# Patient Record
Sex: Female | Born: 1990 | Race: Black or African American | Hispanic: No | Smoking: Never smoker
Health system: Southern US, Community
[De-identification: ages and names within clinical notes are randomized; demographics above are authoritative.]

---

## 2015-06-03 ENCOUNTER — Emergency Department (HOSPITAL_COMMUNITY)
Admission: EM | Admit: 2015-06-03 | Discharge: 2015-06-03 | Disposition: A | Discharge status: Home / Self Care | Payer: Medicaid - Out of State | Attending: Emergency Medicine | Admitting: Emergency Medicine

## 2015-06-03 ENCOUNTER — Encounter (HOSPITAL_COMMUNITY): Payer: Self-pay | Admitting: Vascular Surgery

## 2015-06-03 DIAGNOSIS — L02811 Cutaneous abscess of head [any part, except face]: Secondary | ICD-10-CM | POA: Insufficient documentation

## 2015-06-03 DIAGNOSIS — L089 Local infection of the skin and subcutaneous tissue, unspecified: Secondary | ICD-10-CM | POA: Insufficient documentation

## 2015-06-03 MED ORDER — SULFAMETHOXAZOLE-TRIMETHOPRIM 800-160 MG PO TABS
1.0000 | ORAL_TABLET | Freq: Two times a day (BID) | ORAL | Status: AC
Start: 2015-06-03 — End: 2015-06-10

## 2015-06-03 MED ORDER — IBUPROFEN 600 MG PO TABS: 600.0000 mg | ORAL_TABLET | Freq: Four times a day (QID) | ORAL | Status: AC | PRN

## 2015-06-03 NOTE — Discharge Instructions (Signed)
Read the information below.  Use the prescribed medication as directed.  Please discuss all new medications with your pharmacist.  You may return to the Emergency Department at any time for worsening condition or any new symptoms that concern you.   If there is any possibility that you might be pregnant, please let your health care provider know and discuss this with the pharmacist to ensure medication safety.   If you develop increased redness, swelling, uncontrolled pain, or fevers greater than 100.4, return to the ER immediately for a recheck.     Abscess An abscess is an infected area that contains a collection of pus and debris.It can occur in almost any part of the body. An abscess is also known as a furuncle or boil. CAUSES  An abscess occurs when tissue gets infected. This can occur from blockage of oil or sweat glands, infection of hair follicles, or a minor injury to the skin. As the body tries to fight the infection, pus collects in the area and creates pressure under the skin. This pressure causes pain. People with weakened immune systems have difficulty fighting infections and get certain abscesses more often.  SYMPTOMS Usually an abscess develops on the skin and becomes a painful mass that is red, warm, and tender. If the abscess forms under the skin, you may feel a moveable soft area under the skin. Some abscesses break open (rupture) on their own, but most will continue to get worse without care. The infection can spread deeper into the body and eventually into the bloodstream, causing you to feel ill.  DIAGNOSIS  Your caregiver will take your medical history and perform a physical exam. A sample of fluid may also be taken from the abscess to determine what is causing your infection. TREATMENT  Your caregiver may prescribe antibiotic medicines to fight the infection. However, taking antibiotics alone usually does not cure an abscess. Your caregiver may need to make a small cut (incision)  in the abscess to drain the pus. In some cases, gauze is packed into the abscess to reduce pain and to continue draining the area. HOME CARE INSTRUCTIONS   Only take over-the-counter or prescription medicines for pain, discomfort, or fever as directed by your caregiver.  If you were prescribed antibiotics, take them as directed. Finish them even if you start to feel better.  If gauze is used, follow your caregiver's directions for changing the gauze.  To avoid spreading the infection:  Keep your draining abscess covered with a bandage.  Wash your hands well.  Do not share personal care items, towels, or whirlpools with others.  Avoid skin contact with others.  Keep your skin and clothes clean around the abscess.  Keep all follow-up appointments as directed by your caregiver. SEEK MEDICAL CARE IF:   You have increased pain, swelling, redness, fluid drainage, or bleeding.  You have muscle aches, chills, or a general ill feeling.  You have a fever. MAKE SURE YOU:   Understand these instructions.  Will watch your condition.  Will get help right away if you are not doing well or get worse. Document Released: 06/24/2005 Document Revised: 03/15/2012 Document Reviewed: 11/27/2011 [facility] Patient Information 2015 [place], [place]. This information is not intended to replace advice given to you by your health care provider. Make sure you discuss any questions you have with your health care provider.  Cellulitis Cellulitis is an infection of the skin and the tissue beneath it. The infected area is usually red and tender. Cellulitis  occurs most often in the arms and lower legs.  CAUSES  Cellulitis is caused by bacteria that enter the skin through cracks or cuts in the skin. The most common types of bacteria that cause cellulitis are staphylococci and streptococci. SIGNS AND SYMPTOMS   Redness and warmth.  Swelling.  Tenderness or pain.  Fever. DIAGNOSIS  Your health care  provider can usually determine what is wrong based on a physical exam. Blood tests may also be done. TREATMENT  Treatment usually involves taking an antibiotic medicine. HOME CARE INSTRUCTIONS   Take your antibiotic medicine as directed by your health care provider. Finish the antibiotic even if you start to feel better.  Keep the infected arm or leg elevated to reduce swelling.  Apply a warm cloth to the affected area up to 4 times per day to relieve pain.  Take medicines only as directed by your health care provider.  Keep all follow-up visits as directed by your health care provider. SEEK MEDICAL CARE IF:   You notice red streaks coming from the infected area.  Your red area gets larger or turns dark in color.  Your bone or joint underneath the infected area becomes painful after the skin has healed.  Your infection returns in the same area or another area.  You notice a swollen bump in the infected area.  You develop new symptoms.  You have a fever. SEEK IMMEDIATE MEDICAL CARE IF:   You feel very sleepy.  You develop vomiting or diarrhea.  You have a general ill feeling (malaise) with muscle aches and pains. MAKE SURE YOU:   Understand these instructions.  Will watch your condition.  Will get help right away if you are not doing well or get worse. Document Released: 06/24/2005 Document Revised: 01/29/2014 Document Reviewed: 11/30/2011 [facility] Patient Information 2015 [place], [place]. This information is not intended to replace advice given to you by your health care provider. Make sure you discuss any questions you have with your health care provider.

## 2015-06-03 NOTE — ED Notes (Signed)
Declined W/C at D/C and was escorted to lobby by RN. 

## 2015-06-03 NOTE — ED Provider Notes (Signed)
CSN: [id number]     Arrival date & time 06/03/15  1739 History  This chart was scribed for non-physician practitioner working Trixie Dredge PA-C with Lavera Guise, MD by Lyndel Safe, ED Scribe. This patient was seen in room TR06C/TR06C and the patient's care was started at 5:54 PM.    Chief Complaint  Patient presents with  . Abscess   The history is provided by the patient. No language interpreter was used.   HPI Comments: Stephanie Ellison is a 24 y.o. female who presents to the Emergency Department complaining of a gradually worsening area of pain and swelling to right side of scalp onset 4 days. The pt reports the pain in the area to be sore. She has applied neosporin and peroxide with no relief. Denies drainage form the area, fevers, chills, nausea, vomiting, headaches, vision changes, otalgia, or dental pain. Pt additionally denies a chance of pregnancy, any known allergies to antibiotics or currently breastfeeding.   History reviewed. No pertinent past medical history. Past Surgical History  Procedure Laterality Date  . Cesarean section     No family history on file. Social History  Substance Use Topics  . Smoking status: Never Smoker   . Smokeless tobacco: Never Used  . Alcohol Use: Yes     Comment: occasionally   OB History    No data available     Review of Systems  Constitutional: Negative for fever and chills.  HENT: Negative for dental problem and ear pain.   Eyes: Negative for visual disturbance.  Gastrointestinal: Negative for nausea and vomiting.  Skin: Positive for color change ( right scalp).  Allergic/Immunologic: Negative for immunocompromised state.  Neurological: Negative for headaches.  Hematological: Does not bruise/bleed easily.  All other systems reviewed and are negative.  Allergies  Review of patient's allergies indicates not on file.  Home Medications   Prior to Admission medications   Not on File   BP 112/68 mmHg  Pulse 89  Temp(Src) 98.8  F (37.1 C) (Oral)  Resp 16  SpO2 100% Physical Exam  Constitutional: She appears well-developed and well-nourished. No distress.  HENT:  Head: Normocephalic and atraumatic.    Right, parietal scalp; single pustule that is TTP with localized erythema surrounding, no edema, no induration, no fluctuance   Eyes: Conjunctivae are normal.  Neck: Normal range of motion. Neck supple.  Pulmonary/Chest: Effort normal.  Neurological: She is alert.  Skin: She is not diaphoretic. There is erythema.  See HENT  Nursing note and vitals reviewed.   ED Course  Procedures  DIAGNOSTIC STUDIES: Oxygen Saturation is 100% on RA, normal by my interpretation.    COORDINATION OF CARE: 5:56 PM Discussed treatment plan with pt. Pt acknowledges and agrees to plan.  6:09 PM Visualized underlying soft tissue with [place]. Discussed with pt plan to prescribe antibiotics and advised pt to apply hot compresses to affected area. Pt acknowledges and agrees to plan.  Labs Review Labs Reviewed - No data to display  Imaging Review No results found. I have personally reviewed and evaluated these images and lab results as part of my medical decision-making.   EKG Interpretation None       EMERGENCY DEPARTMENT US SOFT TISSUE INTERPRETATION "Study: Limited Ultrasound of the noted body part in comments below"  INDICATIONS: Soft tissue infection Multiple views of the body part are obtained with a multi-frequency linear probe  PERFORMED BY:  Myself  IMAGES ARCHIVED?: Yes  SIDE:Right   BODY PART:Other soft tisse (comment in note)  FINDINGS: Abcess present  LIMITATIONS:  Emergent Procedure  INTERPRETATION:  Abcess present  COMMENT:  Scalp with pustule and pocket of fluid slightly deeper than what is visible.  There is clinically some localized cellulitis.     Engaged in joint decision making with the patient and offered I&D in department.  Pt declines.  The pustule did start draining after [place] so pt may  have success with warm moist compresses at home.  Discussed home care and return precautions.   MDM   Final diagnoses:  Infection of scalp  Cutaneous abscess of head excluding face    Afebrile, nontoxic patient with scalp infection consisting of pustule with underlying fluid collection c/w small superficial abscess and surrounding cellulitis.  Offered I&D, please see above.   D/C home with bactrim, ibuprofen.  Discussed result, findings, treatment, and follow up  with patient.  Pt given return precautions.  Pt verbalizes understanding and agrees with plan.       I personally performed the services described in this documentation, which was scribed in my presence. The recorded information has been reviewed and is accurate.   [place], PA-C 06/03/15 1834  Lavera Guise, MD 06/04/15 1150

## 2015-06-03 NOTE — ED Notes (Signed)
Pt reports to the ED for eval of abscess to head. She reports she had her hair done 1 week ago but did not use any new products. Pt had another similar bump but it resolved. Small, pus filled abscess noted to head. Pt A&Ox4, resp e/u, and skin warm and dry.

## 2015-10-21 ENCOUNTER — Emergency Department (HOSPITAL_COMMUNITY): Payer: No Typology Code available for payment source

## 2015-10-21 ENCOUNTER — Encounter (HOSPITAL_COMMUNITY): Payer: Self-pay | Admitting: *Deleted

## 2015-10-21 ENCOUNTER — Emergency Department (HOSPITAL_COMMUNITY)
Admission: EM | Admit: 2015-10-21 | Discharge: 2015-10-21 | Disposition: A | Discharge status: Home / Self Care | Payer: No Typology Code available for payment source | Attending: Emergency Medicine | Admitting: Emergency Medicine

## 2015-10-21 DIAGNOSIS — Z3202 Encounter for pregnancy test, result negative: Secondary | ICD-10-CM | POA: Insufficient documentation

## 2015-10-21 DIAGNOSIS — S161XXA Strain of muscle, fascia and tendon at neck level, initial encounter: Secondary | ICD-10-CM | POA: Insufficient documentation

## 2015-10-21 DIAGNOSIS — Y9389 Activity, other specified: Secondary | ICD-10-CM | POA: Diagnosis not present

## 2015-10-21 DIAGNOSIS — M546 Pain in thoracic spine: Secondary | ICD-10-CM

## 2015-10-21 DIAGNOSIS — S29002A Unspecified injury of muscle and tendon of back wall of thorax, initial encounter: Secondary | ICD-10-CM | POA: Diagnosis not present

## 2015-10-21 DIAGNOSIS — S3992XA Unspecified injury of lower back, initial encounter: Secondary | ICD-10-CM | POA: Diagnosis not present

## 2015-10-21 DIAGNOSIS — R110 Nausea: Secondary | ICD-10-CM | POA: Insufficient documentation

## 2015-10-21 DIAGNOSIS — S3991XA Unspecified injury of abdomen, initial encounter: Secondary | ICD-10-CM | POA: Insufficient documentation

## 2015-10-21 DIAGNOSIS — M545 Low back pain: Secondary | ICD-10-CM

## 2015-10-21 DIAGNOSIS — Y92410 Unspecified street and highway as the place of occurrence of the external cause: Secondary | ICD-10-CM | POA: Insufficient documentation

## 2015-10-21 DIAGNOSIS — S199XXA Unspecified injury of neck, initial encounter: Secondary | ICD-10-CM | POA: Diagnosis present

## 2015-10-21 DIAGNOSIS — Y998 Other external cause status: Secondary | ICD-10-CM | POA: Insufficient documentation

## 2015-10-21 LAB — URINALYSIS, ROUTINE W REFLEX MICROSCOPIC
BILIRUBIN URINE: NEGATIVE
GLUCOSE, UA: NEGATIVE mg/dL
HGB URINE DIPSTICK: NEGATIVE
Ketones, ur: NEGATIVE mg/dL
Nitrite: NEGATIVE
PH: 7 (ref 5.0–8.0)
Protein, ur: NEGATIVE mg/dL
SPECIFIC GRAVITY, URINE: 1.029 (ref 1.005–1.030)

## 2015-10-21 LAB — I-STAT BETA HCG BLOOD, ED (MC, WL, AP ONLY): I-stat hCG, quantitative: <5 m[IU]/mL (ref <5)

## 2015-10-21 LAB — COMPREHENSIVE METABOLIC PANEL
ALBUMIN: 3.9 g/dL (ref 3.5–5.0)
ALT: 15 U/L (ref 14–54)
AST: 21 U/L (ref 15–41)
Alkaline Phosphatase: 67 U/L (ref 38–126)
Anion gap: 9 (ref 5–15)
BUN: 9 mg/dL (ref 6–20)
CHLORIDE: 108 mmol/L (ref 101–111)
CO2: 25 mmol/L (ref 22–32)
Calcium: 9.4 mg/dL (ref 8.9–10.3)
Creatinine, Ser: 0.63 mg/dL (ref 0.44–1.00)
GFR calc Af Amer: >60 mL/min (ref >60)
Glucose, Bld: 90 mg/dL (ref 65–99)
POTASSIUM: 4.3 mmol/L (ref 3.5–5.1)
SODIUM: 142 mmol/L (ref 135–145)
Total Bilirubin: 0.7 mg/dL (ref 0.3–1.2)
Total Protein: 6.8 g/dL (ref 6.5–8.1)

## 2015-10-21 LAB — URINE MICROSCOPIC-ADD ON

## 2015-10-21 LAB — CBC WITH DIFFERENTIAL/PLATELET
Basophils Absolute: 0 10*3/uL (ref 0.0–0.1)
Basophils Relative: 1 %
EOS PCT: 2 %
Eosinophils Absolute: 0.2 10*3/uL (ref 0.0–0.7)
HCT: 37.9 % (ref 36.0–46.0)
HEMOGLOBIN: 13.1 g/dL (ref 12.0–15.0)
LYMPHS ABS: 4.1 10*3/uL — AB (ref 0.7–4.0)
LYMPHS PCT: 51 %
MCH: 31.8 pg (ref 26.0–34.0)
MCHC: 34.6 g/dL (ref 30.0–36.0)
MCV: 92 fL (ref 78.0–100.0)
MONOS PCT: 5 %
Monocytes Absolute: 0.4 10*3/uL (ref 0.1–1.0)
NEUTROS PCT: 41 %
Neutro Abs: 3.3 10*3/uL (ref 1.7–7.7)
Platelets: 274 10*3/uL (ref 150–400)
RBC: 4.12 MIL/uL (ref 3.87–5.11)
RDW: 13 % (ref 11.5–15.5)
WBC: 8 10*3/uL (ref 4.0–10.5)

## 2015-10-21 MED ORDER — IOHEXOL 300 MG/ML  SOLN
80.0000 mL | Freq: Once | INTRAMUSCULAR | Status: AC | PRN
  Administered 2015-10-21: 100 mL via INTRAVENOUS

## 2015-10-21 MED ORDER — SODIUM CHLORIDE 0.9 % IV SOLN: 1000.0000 mL | INTRAVENOUS | Status: DC

## 2015-10-21 MED ORDER — SODIUM CHLORIDE 0.9 % IV SOLN
1000.0000 mL | Freq: Once | INTRAVENOUS | Status: AC
  Administered 2015-10-21: 1000 mL via INTRAVENOUS

## 2015-10-21 MED ORDER — ONDANSETRON HCL 4 MG/2ML IJ SOLN
4.0000 mg | Freq: Once | INTRAMUSCULAR | Status: AC
  Administered 2015-10-21: 4 mg via INTRAVENOUS
  Filled 2015-10-21: qty 2

## 2015-10-21 MED ORDER — MORPHINE SULFATE (PF) 4 MG/ML IV SOLN
4.0000 mg | Freq: Once | INTRAVENOUS | Status: AC
  Administered 2015-10-21: 4 mg via INTRAVENOUS
  Filled 2015-10-21: qty 1

## 2015-10-21 MED ORDER — OXYCODONE-ACETAMINOPHEN 5-325 MG PO TABS: 1.0000 | ORAL_TABLET | ORAL | Status: AC | PRN
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
## 2015-10-21 NOTE — ED Notes (Signed)
Patient transported to CT 

## 2015-10-21 NOTE — ED Notes (Signed)
Pt departed in NAD.  
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
## 2016-12-24 ENCOUNTER — Encounter (HOSPITAL_COMMUNITY): Payer: Self-pay

## 2016-12-24 ENCOUNTER — Emergency Department (HOSPITAL_COMMUNITY)
Admission: EM | Admit: 2016-12-24 | Discharge: 2016-12-24 | Disposition: A | Discharge status: Home / Self Care | Payer: BLUE CROSS/BLUE SHIELD | Attending: Emergency Medicine | Admitting: Emergency Medicine

## 2016-12-24 DIAGNOSIS — L42 Pityriasis rosea: Secondary | ICD-10-CM | POA: Insufficient documentation

## 2016-12-24 DIAGNOSIS — R21 Rash and other nonspecific skin eruption: Secondary | ICD-10-CM | POA: Diagnosis present

## 2016-12-24 LAB — HEPATIC FUNCTION PANEL
ALBUMIN: 3.8 g/dL (ref 3.5–5.0)
ALT: 19 U/L (ref 14–54)
AST: 21 U/L (ref 15–41)
Alkaline Phosphatase: 56 U/L (ref 38–126)
Bilirubin, Direct: 0.1 mg/dL (ref 0.1–0.5)
Indirect Bilirubin: 0.2 mg/dL — ABNORMAL LOW (ref 0.3–0.9)
TOTAL PROTEIN: 6.5 g/dL (ref 6.5–8.1)
Total Bilirubin: 0.3 mg/dL (ref 0.3–1.2)

## 2016-12-24 LAB — CBC WITH DIFFERENTIAL/PLATELET
BASOS ABS: 0 10*3/uL (ref 0.0–0.1)
Basophils Relative: 0 %
EOS ABS: 0.1 10*3/uL (ref 0.0–0.7)
EOS PCT: 1 %
HCT: 35.1 % — ABNORMAL LOW (ref 36.0–46.0)
Hemoglobin: 11.9 g/dL — ABNORMAL LOW (ref 12.0–15.0)
Lymphocytes Relative: 49 %
Lymphs Abs: 2.8 10*3/uL (ref 0.7–4.0)
MCH: 31.3 pg (ref 26.0–34.0)
MCHC: 33.9 g/dL (ref 30.0–36.0)
MCV: 92.4 fL (ref 78.0–100.0)
Monocytes Absolute: 0.4 10*3/uL (ref 0.1–1.0)
Monocytes Relative: 7 %
Neutro Abs: 2.5 10*3/uL (ref 1.7–7.7)
Neutrophils Relative %: 43 %
PLATELETS: 235 10*3/uL (ref 150–400)
RBC: 3.8 MIL/uL — ABNORMAL LOW (ref 3.87–5.11)
RDW: 12.4 % (ref 11.5–15.5)
WBC: 5.9 10*3/uL (ref 4.0–10.5)

## 2016-12-24 MED ORDER — FAMOTIDINE 20 MG PO TABS: 20.0000 mg | ORAL_TABLET | Freq: Two times a day (BID) | ORAL | 0 refills | Status: AC

## 2016-12-24 MED ORDER — FAMOTIDINE IN NACL 20-0.9 MG/50ML-% IV SOLN
20.0000 mg | Freq: Once | INTRAVENOUS | Status: AC
  Administered 2016-12-24: 20 mg via INTRAVENOUS
  Filled 2016-12-24: qty 50

## 2016-12-24 MED ORDER — DIPHENHYDRAMINE HCL 25 MG PO TABS: 25.0000 mg | ORAL_TABLET | Freq: Four times a day (QID) | ORAL | 0 refills | Status: AC

## 2016-12-24 MED ORDER — DIPHENHYDRAMINE HCL 50 MG/ML IJ SOLN
12.5000 mg | Freq: Once | INTRAMUSCULAR | Status: AC
  Administered 2016-12-24: 12.5 mg via INTRAVENOUS
  Filled 2016-12-24: qty 1

## 2016-12-24 NOTE — ED Triage Notes (Signed)
Patient complains of itchy rash to body that started yesterday took benadryl with some relief. Complains this am rash is improved but continuing to itch. No airway problem, NAD. Had a half of a benadryl today

## 2016-12-24 NOTE — Discharge Instructions (Signed)
Follow up with your OB. Return here as needed.

## 2016-12-24 NOTE — ED Provider Notes (Signed)
Medical screening examination/treatment/procedure(s) were conducted as a shared visit with non-physician practitioner(s) and myself.  I personally evaluated the patient during the encounter.  26 year old with a pruritic rash to her neck and back.  It started on her neck and then has spread across her back with a little bit on her 6 extremities as well. 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 UA we'll treat with supportive care. She is not having wheezing, nausea, vomiting, abdominal pain, low blood pressures, syncope or other evidence of anaphylaxis so we'll continue treatment as outpatient.   Marily MemosJason Meklit Cotta, MD 12/26/16 [phone]

## 2016-12-24 NOTE — ED Notes (Signed)
Pt reports she noticed a rash starting yesterday around 1300 that slowly started spreading and covered her back and arms last night. Pt denies any new medications, soaps, or anything else she can relate this breakout to. NAD. Red whelps noted to back and arms.

## 2016-12-24 NOTE — ED Provider Notes (Signed)
[facility]-EMERGENCY DEPT Provider Note   CSN: [id number] Arrival date & time: 12/24/16  1305  By signing my name below, I, Linna Darner, attest that this documentation has been prepared under the direction and in the presence of non-physician practitioner, [facility] M. Damian Leavell, NP. Electronically Signed: Linna Darner, Scribe. 12/24/2016. 5:17 PM.  History   Chief Complaint Chief Complaint  Patient presents with  . itchy rash    The history is provided by the patient. No language interpreter was used.  Rash   This is a new problem. The current episode started yesterday. The problem has been gradually worsening. The problem is associated with an unknown factor. There has been no fever. The rash is present on the torso, back, neck, right arm, left arm, left upper leg, right upper leg and genitalia. The patient is experiencing no pain. Associated symptoms include itching. She has tried nothing for the symptoms.     HPI Comments: Stephanie Ellison is a 26 y.o. female who presents to the Emergency Department complaining of a constant, gradually worsening, pruritic rash beginning yesterday afternoon around 1 PM. She states she initially noticed "bumps" to her neck and right arm which have now spread throughout her body. No alleviating factors noted. No recent unusual food or fluid intake. No new soaps, lotions, or detergents. Pt is [redacted] weeks pregnant and uses prenatal pills daily but denies any changes in this medication. No other daily medications. She denies SOB, trouble swallowing, throat swelling, fevers, chills, urinary frequency, dysuria, dark urine, vaginal discharge/bleeding, or any other associated symptoms. Pt is followed by an OB/GYN.  History reviewed. No pertinent past medical history.  There are no active problems to display for this patient.   Past Surgical History:  Procedure Laterality Date  . CESAREAN SECTION      OB History    No data available       Home Medications    Prior  to Admission medications   Medication Sig Start Date End Date Taking? Authorizing Provider  diphenhydrAMINE (BENADRYL) 25 MG tablet Take 1 tablet (25 mg total) by mouth every 6 (six) hours. 12/24/16   Hope Orlene Och, NP  famotidine (PEPCID) 20 MG tablet Take 1 tablet (20 mg total) by mouth 2 (two) times daily. 12/24/16   Hope Orlene Och, NP  ibuprofen (ADVIL,MOTRIN) 600 MG tablet Take 1 tablet (600 mg total) by mouth every 6 (six) hours as needed for mild pain or moderate pain. 06/03/15   Trixie Dredge, PA-C  oxyCODONE-acetaminophen (PERCOCET) 5-325 MG tablet Take 1 tablet by mouth every 4 (four) hours as needed for moderate pain. 10/21/15   [name], MD    Family History No family history on file.  Social History Social History  Substance Use Topics  . Smoking status: Never Smoker  . Smokeless tobacco: Never Used  . Alcohol use Yes     Comment: occasionally     Allergies   Patient has no known allergies.   Review of Systems Review of Systems  Constitutional: Negative for chills and fever.  HENT: Negative for trouble swallowing.   Respiratory: Negative for shortness of breath.   Genitourinary: Negative for dysuria, frequency, vaginal bleeding and vaginal discharge.       [redacted] weeks pregnant  Skin: Positive for itching and rash.  All other systems reviewed and are negative.  Physical Exam Updated Vital Signs BP 107/60 (BP Location: Left Arm)   Pulse 85   Temp 98.5 F (36.9 C) (Oral)   Resp 16  Ht 5\' 2"  (1.575 m)   Wt 64 kg   SpO2 100%   BMI 25.79 kg/m   Physical Exam  Constitutional: She is oriented to person, place, and time. She appears well-developed and well-nourished. No distress.  HENT:  Head: Normocephalic and atraumatic.  No difficulty swallowing or swelling of the throat.  Eyes: Conjunctivae and EOM are normal.  Neck: Neck supple. No tracheal deviation present.  Cardiovascular: Normal rate and regular rhythm.   Pulmonary/Chest: Effort normal and breath sounds  normal. No respiratory distress.  Musculoskeletal: Normal range of motion.  Neurological: She is alert and oriented to person, place, and time.  Skin: Skin is warm and dry.  Oval-shaped, dry patch to the posterior aspect of the neck, herald patch, and multiple smaller, similar lesions in a Christmas-tree pattern on her back. Lesions noted to the inner aspects of the thighs and to the upper arms. No signs of bacterial infection.  Psychiatric: She has a normal mood and affect. Her behavior is normal.  Nursing note and vitals reviewed.  ED Treatments / Results  Labs (all labs ordered are listed, but only abnormal results are displayed) Labs Reviewed  CBC WITH DIFFERENTIAL/PLATELET - Abnormal; Notable for the following:       Result Value   RBC 3.80 (*)    Hemoglobin 11.9 (*)    HCT 35.1 (*)    All other components within normal limits  HEPATIC FUNCTION PANEL - Abnormal; Notable for the following:    Indirect Bilirubin 0.2 (*)    All other components within normal limits    Radiology No results found.  Procedures Procedures (including critical care time)  DIAGNOSTIC STUDIES: Oxygen Saturation is 100% on RA, normal by my interpretation.    COORDINATION OF CARE: 5:23 PM Discussed treatment plan with pt at bedside and pt agreed to plan.  Medications Ordered in ED Medications  famotidine (PEPCID) IVPB 20 mg premix (0 mg Intravenous Stopped 12/24/16 1908)  diphenhydrAMINE (BENADRYL) injection 12.5 mg (12.5 mg Intravenous Given 12/24/16 1838)     Initial Impression / Assessment and Plan / ED Course  I have reviewed the triage vital signs and the nursing notes.  Pertinent lab results that were available during my care of the patient were reviewed by me and considered in my medical decision making (see chart for details).   Final Clinical Impressions(s) / ED Diagnoses  Patient with rash which is most likely pityriasis rosea. Will check LFT's for possible cholestasis. 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 Discussed diff dx with patient and she will f/u with her OB. Will treat the itching.  Final diagnoses:  Pityriasis rosea    New Prescriptions Discharge Medication List as of 12/24/2016  7:42 PM    START taking these medications   Details  diphenhydrAMINE (BENADRYL) 25 MG tablet Take 1 tablet (25 mg total) by mouth every 6 (six) hours., Starting Thu 12/24/2016, Print    famotidine (PEPCID) 20 MG tablet Take 1 tablet (20 mg total) by mouth 2 (two) times daily., Starting Thu 12/24/2016, Print      I personally performed the services described in this documentation, which was scribed in my presence. The recorded information has been reviewed and is accurate.    [place], NP 12/26/16 1624    Marily MemosJason Mesner, MD 12/26/16 [id number]

## 2016-12-24 NOTE — ED Notes (Signed)
Pt ambulated to room from waiting room, tolerated well. 

## 2016-12-24 NOTE — ED Notes (Signed)
Pt ambulatory at DC, NAD, VSS. Pt verbalizes DC teaching and medications. E-Signature pad not working.
